# Patient Record
Sex: Male | Born: 1980 | Race: White | Hispanic: No | Marital: Single | Smoking: Never smoker
Health system: Southern US, Community
[De-identification: ages and names within clinical notes are randomized; demographics above are authoritative.]

## PROBLEM LIST (undated history)

## (undated) DIAGNOSIS — F329 Major depressive disorder, single episode, unspecified: Secondary | ICD-10-CM

## (undated) DIAGNOSIS — F32A Depression, unspecified: Secondary | ICD-10-CM

## (undated) DIAGNOSIS — Z21 Asymptomatic human immunodeficiency virus [HIV] infection status: Secondary | ICD-10-CM

## (undated) DIAGNOSIS — B20 Human immunodeficiency virus [HIV] disease: Secondary | ICD-10-CM

## (undated) DIAGNOSIS — F419 Anxiety disorder, unspecified: Secondary | ICD-10-CM

## (undated) HISTORY — DX: Human immunodeficiency virus (HIV) disease: B20

## (undated) HISTORY — DX: Asymptomatic human immunodeficiency virus (hiv) infection status: Z21

## (undated) HISTORY — DX: Depression, unspecified: F32.A

## (undated) HISTORY — DX: Anxiety disorder, unspecified: F41.9

## (undated) HISTORY — PX: OTHER SURGICAL HISTORY: SHX169

## (undated) HISTORY — PX: HERNIA REPAIR: SHX51

## (undated) HISTORY — DX: Major depressive disorder, single episode, unspecified: F32.9

## (undated) HISTORY — PX: GALLBLADDER SURGERY: SHX652

---
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
## 2008-10-29 ENCOUNTER — Ambulatory Visit (HOSPITAL_COMMUNITY): Admission: RE | Admit: 2008-10-29 | Discharge: 2008-10-29 | Payer: Self-pay | Admitting: Surgery

## 2008-11-18 ENCOUNTER — Encounter: Admission: RE | Admit: 2008-11-18 | Discharge: 2008-11-18 | Payer: Self-pay | Admitting: Surgery

## 2008-12-03 ENCOUNTER — Encounter: Admission: RE | Admit: 2008-12-03 | Discharge: 2008-12-03 | Payer: Self-pay | Admitting: Surgery

## 2009-07-22 IMAGING — CT CT PELVIS W/ CM
2 of 4 series · 14 of 32 positions shown, 19 images · IV contrast (READICAT/WATER & [ID] OMNI 300)
Comparison: Anterior abdominal wall (periumbilical) [HOSPITAL]
[HOSPITAL] [REDACTED] [HOSPITAL] ultrasound 11/18/2008.

CT ABDOMEN

CLINICAL DATA: Post ventral herniorrhaphy with operative site
swelling and prior aspiration of fluid.

CT ABDOMEN AND PELVIS WITHOUT AND WITH CONTRAST
TECHNIQUE: Multidetector CT imaging of the abdomen and pelvis was
performed following the standard protocol before and following the
bolus administration of intravenous contrast.
Contrast: Intravenous 100 ml Dmnipaque-M11.

[Series 2: abdomen w/ · axial · 0.70mm/px · z∈[-395,-75]mm · 7 of 86 slices shown, 12 images]
[im 11/86  soft-tissue]
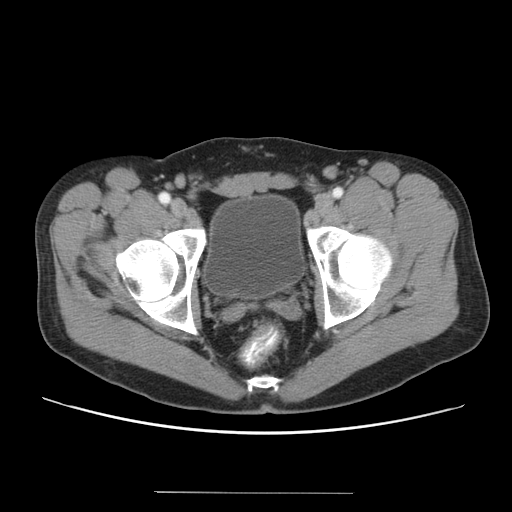
[im 11/86  bone]
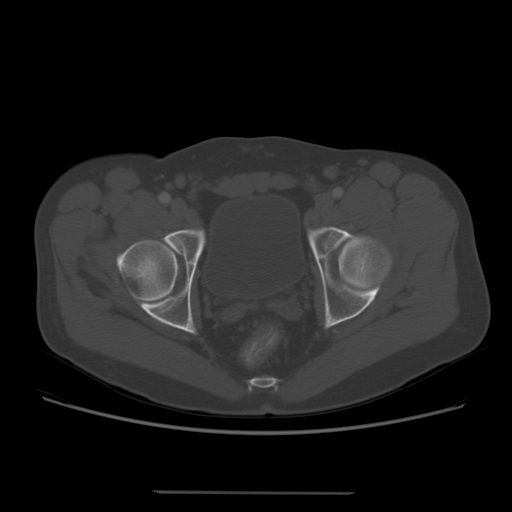
[im 22/86  soft-tissue]
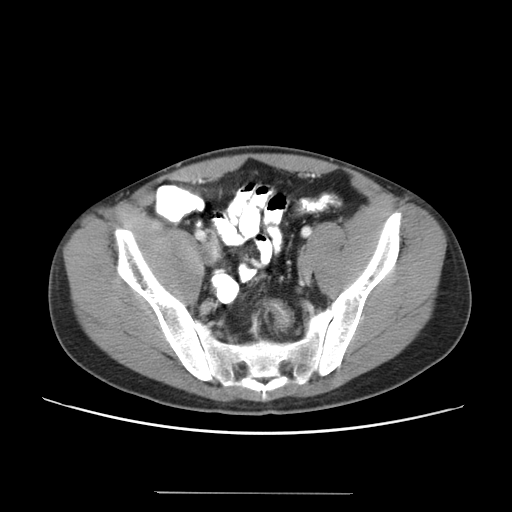
[im 32/86  soft-tissue]
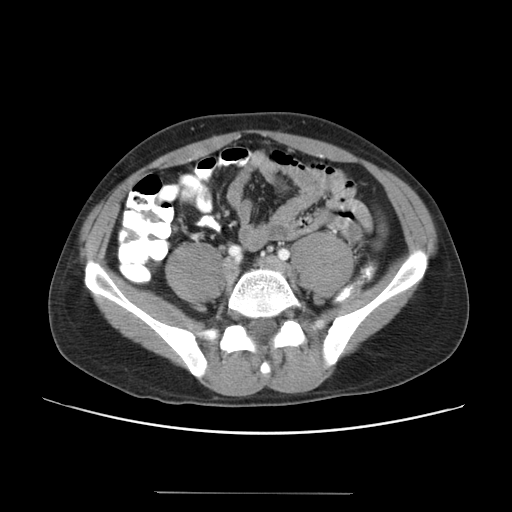
[im 43/86  soft-tissue]
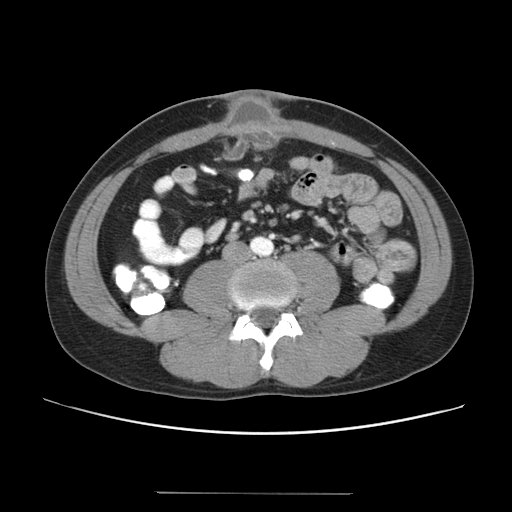
[im 43/86  lung]
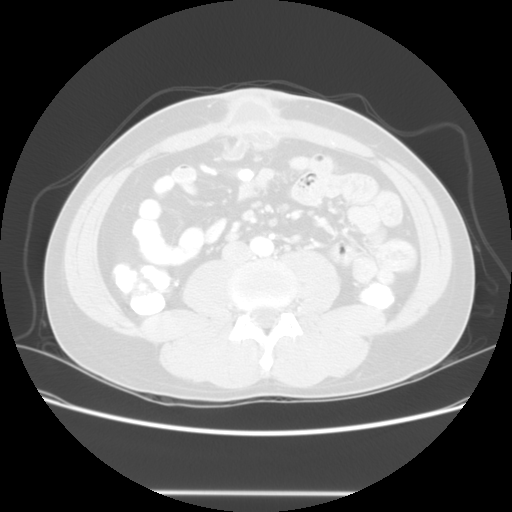
[im 54/86  soft-tissue]
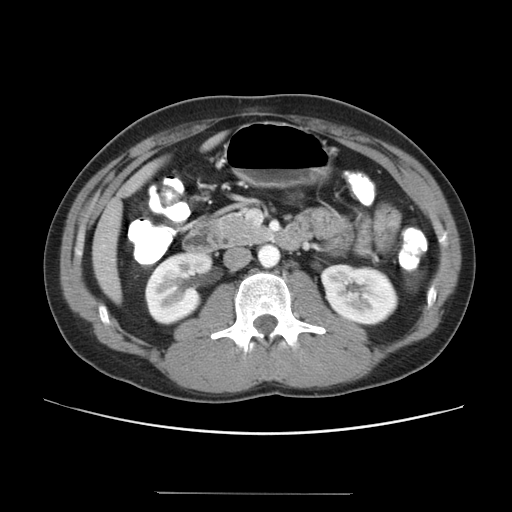
[im 54/86  lung]
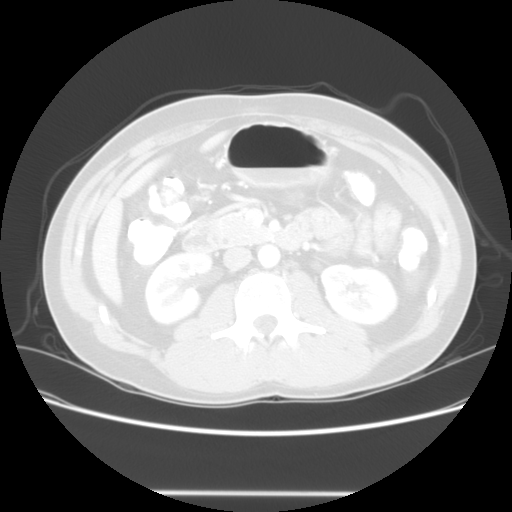
[im 64/86  soft-tissue]
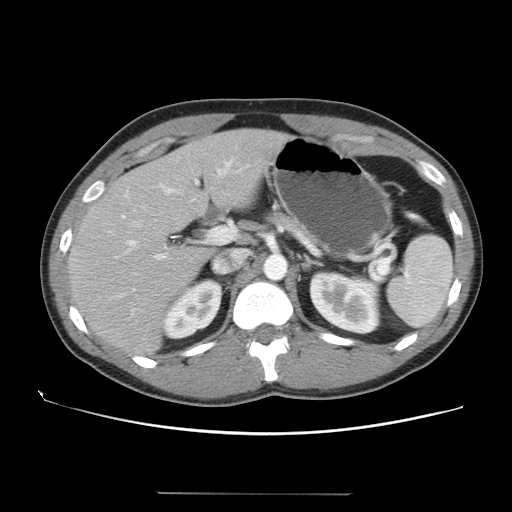
[im 64/86  lung]
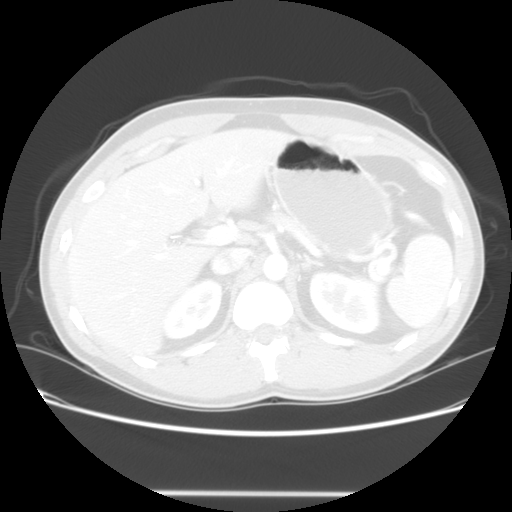
[im 75/86  soft-tissue]
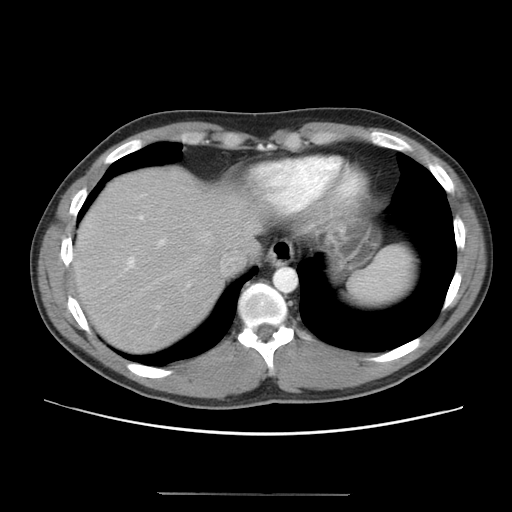
[im 75/86  lung]
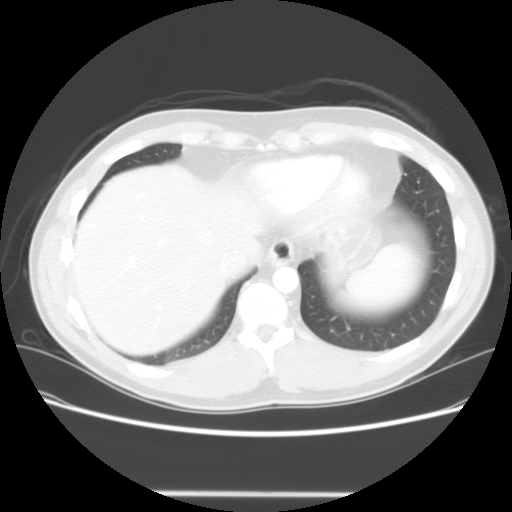

[Series 401: sagittal · sagittal · 0.90mm/px · 7 of 120 slices shown]
[im 11/120  soft-tissue]
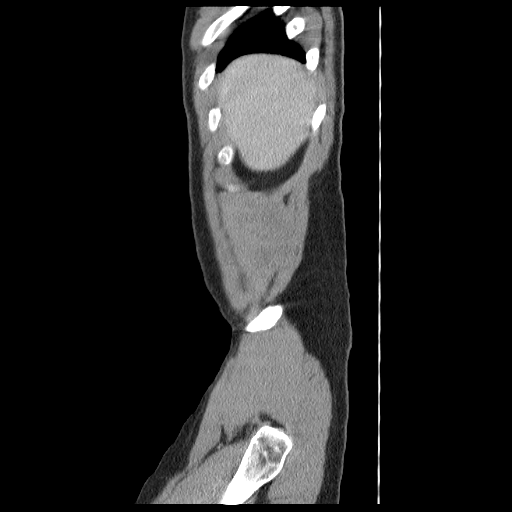
[im 22/120  soft-tissue]
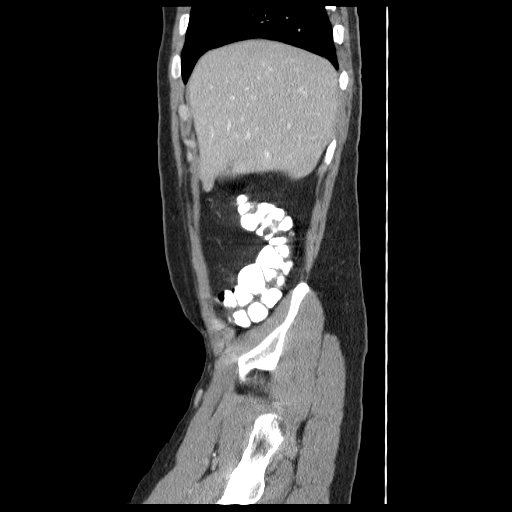
[im 44/120  soft-tissue]
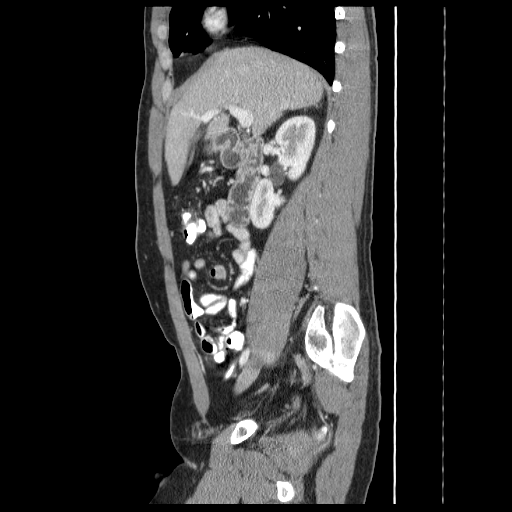
[im 55/120  soft-tissue]
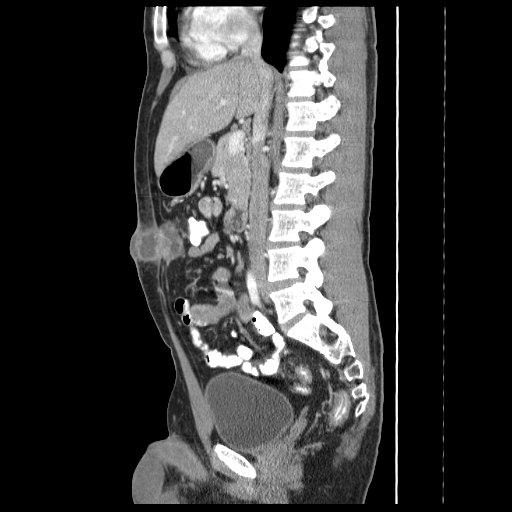
[im 65/120  soft-tissue]
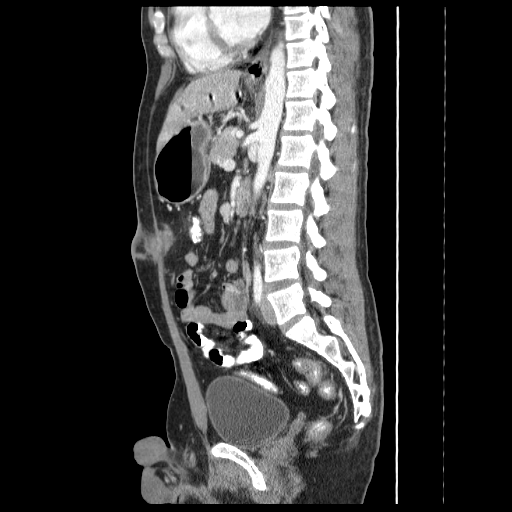
[im 76/120  soft-tissue]
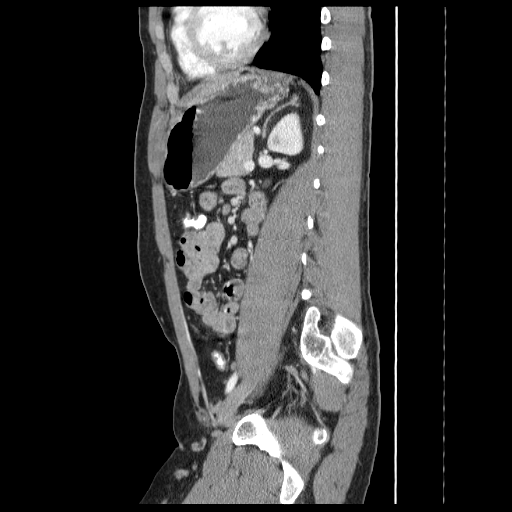
[im 98/120  soft-tissue]
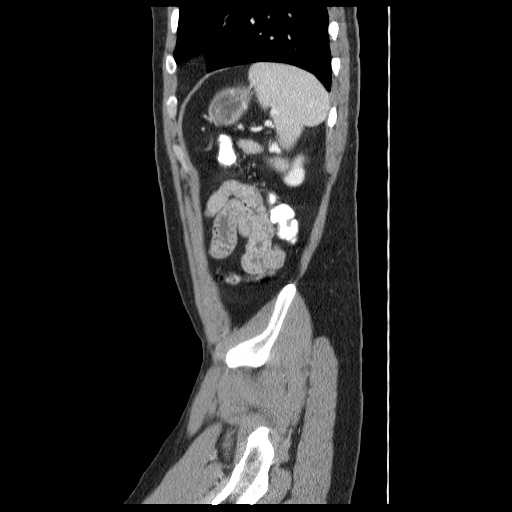

[14 of 32 positions shown; findings below may reference images not displayed]

FINDINGS: Slight decrease in previous complex fluid collection at
periumbilical ventral herniorrhaphy since previous ultrasound
11/18/2008 is seen.  Currently fluid collection measures 3.0 cm
long (sagittal image 57) 1.6 cm AP X 2.8 cm wide (axial image 45)
(11/18/2008 ultrasound measured 4.7 cm long X 1.9 cm AP X 4.9 cm
wide).  No evidence for recurrent ventral hernia is seen.
Subadjacent to herniorrhaphy is marginated serpiginous ventral
intraperitoneal fluid collection measuring 3.7 cm long (sagittal
image 61) X 1.1 cm AP X 4.0 cm wide (axial image 43).  Bases of the
lungs are clear.  Postcholecystectomy state with no dilated biliary
tract visualized.  Incidental very small hiatus hernia is seen with
stomach otherwise normal-appearing.  Remaining abdominal organs
appear normal without inflammation, free fluid, abscess or
adenopathy.
IMPRESSION: 1.  Decreasing size probable seroma at the ventral herniorrhaphy.
Subadjacent intraperitoneal fluid collection favors seroma
(infected seroma/abscess cannot be excluded).
2.  Post cholecystectomy and incidental very small hiatus hernia.
3.  Otherwise, negative.

CT PELVIS
FINDINGS: Pelvic organs appear normal without inflammation, free
fluid or adenopathy.
IMPRESSION: Normal.

## 2010-11-14 NOTE — Op Note (Signed)
NAMEJEVANTE, Johnathan Bowers             ACCOUNT NO.:  [id number]   MEDICAL RECORD NO.:  [id number]          PATIENT TYPE:  AMB   LOCATION:  DAY                          FACILITY:  [facility]   PHYSICIAN:  Wilmon Arms. Corliss Skains, M.D. DATE OF BIRTH:  07/28/1980   DATE OF PROCEDURE:  10/29/2008  DATE OF DISCHARGE:                               OPERATIVE REPORT   PREOPERATIVE DIAGNOSIS:  Recurrent ventral umbilical hernia.   POSTOPERATIVE DIAGNOSIS:  Recurrent ventral umbilical hernia.   PROCEDURE PERFORMED:  Recurrent umbilical hernia repair with mesh.   SURGEON:  Wilmon Arms. Tsuei, M.D.   ANESTHESIA:  General.   INDICATIONS:  The patient is a 30 year old male who is 3 years postop  from a laparoscopic cholecystectomy done in [place], [place].  At the time of surgery he was noted to have a small umbilical hernia.  This was repaired with a 0 Vicryl in figure-of-eight fashion.  The  patient has since developed a hernia bulge which is protruding above his  umbilicus.  This has become uncomfortable and the patient presents  requesting repair.   DESCRIPTION OF PROCEDURE:  The patient was brought to the operating room  and placed in the supine position on the operating  room table.  After  an adequate level of general anesthesia was obtained, the patient's  abdomen was prepped with Betadine and draped in sterile fashion.  A time-  out was taken to assure the proper patient and proper procedure.  I  palpated the hernia which was protruding superiorly and to the right of  his umbilicus.  I was able to mostly reduce the hernia and it felt like  the hernia defect was actually at the lower end of his umbilicus.  Therefore, we opened his previous infraumbilical scar.  We had  lengthened it slightly.  Dissection was carried down through the  subcutaneous tissues to the fascia.  We then worked our way superiorly,  dissecting the umbilicus off of the underlying fascia.  We encountered  the hernia  defect.  There was a moderate amount of omentum that had  herniated through this area.  He had kind of a multiloculated hernia sac  and we were able to peel all of this off of the overlying dermis.  We  reduced the omentum and the hernia sac back into the peritoneal cavity  and undermined the fascia in all directions.  A medium PROCEED ventral  patch was then inserted into the peritoneal cavity and was deployed.  It  seemed to lie flat with no omentum around the edges.  The mesh was  sutured in place with 4 interrupted 0 Novofil pop-off sutures.  The tabs  were cut short and were then secured to the edge of the fascial defect.  The fascial defect was reapproximated with figure-of-eight 0 Novofil  sutures.  A 3-0 Vicryl was used to tack down the dermis to the fascia,  closing down the hernia sac superiorly.  We also tacked down the base of  the  umbilicus to the fascia.  The subcutaneous tissues were closed with 3-0  Vicryl and the subcuticular layer  was closed with 4-0 Monocryl.  Steri-  Strips and clean dressings were applied.  The patient was then extubated  and brought to the recovery room in stable condition.  All sponge,  instrument, and needle counts were correct.      Wilmon Arms. Tsuei, M.D.  Electronically Signed     MKT/MEDQ  D:  10/29/2008  T:  10/29/2008  Job:  [id number]

## 2017-05-02 DIAGNOSIS — Z95810 Presence of automatic (implantable) cardiac defibrillator: Secondary | ICD-10-CM

## 2017-05-02 DIAGNOSIS — I509 Heart failure, unspecified: Secondary | ICD-10-CM

## 2017-05-02 DIAGNOSIS — I4891 Unspecified atrial fibrillation: Secondary | ICD-10-CM

## 2017-05-05 DIAGNOSIS — I509 Heart failure, unspecified: Secondary | ICD-10-CM

## 2017-05-06 DIAGNOSIS — I4891 Unspecified atrial fibrillation: Secondary | ICD-10-CM

## 2017-05-06 DIAGNOSIS — E871 Hypo-osmolality and hyponatremia: Secondary | ICD-10-CM

## 2017-05-06 DIAGNOSIS — I5023 Acute on chronic systolic (congestive) heart failure: Secondary | ICD-10-CM

## 2017-09-09 DIAGNOSIS — B20 Human immunodeficiency virus [HIV] disease: Secondary | ICD-10-CM | POA: Insufficient documentation

## 2017-09-19 ENCOUNTER — Encounter (HOSPITAL_COMMUNITY): Payer: Self-pay | Admitting: Psychiatry

## 2017-09-19 ENCOUNTER — Ambulatory Visit (INDEPENDENT_AMBULATORY_CARE_PROVIDER_SITE_OTHER): Payer: 59 | Admitting: Psychiatry

## 2017-09-19 VITALS — BP 108/68 | HR 104 | Ht 65.0 in | Wt 118.0 lb

## 2017-09-19 DIAGNOSIS — F411 Generalized anxiety disorder: Secondary | ICD-10-CM | POA: Diagnosis not present

## 2017-09-19 DIAGNOSIS — F5102 Adjustment insomnia: Secondary | ICD-10-CM

## 2017-09-19 DIAGNOSIS — F321 Major depressive disorder, single episode, moderate: Secondary | ICD-10-CM | POA: Diagnosis not present

## 2017-09-19 DIAGNOSIS — R450 Nervousness: Secondary | ICD-10-CM | POA: Diagnosis not present

## 2017-09-19 DIAGNOSIS — F4323 Adjustment disorder with mixed anxiety and depressed mood: Secondary | ICD-10-CM

## 2017-09-19 DIAGNOSIS — B20 Human immunodeficiency virus [HIV] disease: Secondary | ICD-10-CM

## 2017-09-19 NOTE — Progress Notes (Signed)
Psychiatric Initial Adult Assessment   Patient Identification: Johnathan Bowers MRN:  [id number] Date of Evaluation:  09/19/2017 Referral Source: Junious SilkJoyin Osa Chief Complaint:   Chief Complaint    Establish Care; Anxiety     Visit Diagnosis:    ICD-10-CM   1. Adjustment disorder with mixed anxiety and depressed mood F43.23   2. Current moderate episode of major depressive disorder, unspecified whether recurrent (HCC) F32.1   3. Anxiety state F41.1   4. Adjustment insomnia F51.02     History of Present Illness:  37 years old white male. Lives with his partner.  Referred for management of anxiety and depression. Sleep technician by profession  Patient has been recently diagnosed with HIV disease. It made him to go into a shock panic and dysphoria he has made an appointment psychiatrist office [facility] so wants to come this office. Seeing a therapist there as well Was started on buspar, ativan, ambien all together. Takes adderall for adhd but has hold off second dose since not at work for last week  He has not told his parents or sister who were strong Christians and they have not known that he has been a gay also the last 15 years they thought he has gone through a straight camp and is straight none K he has been limiting his partner they're not aware that he is living with his partner He has told his partner of HIV but no one else  Works as Research officer, trade unionleep technician in office .  His worries were excessive, panic last week, somewhat more in control but still feels down, fatigue and anxious  has been loosing weight and says he may have gotten pricked by a patients needle a while back leading to current HIV  Aggravating F: recent diagnosed HIV. Family not aware MOdifying F: friend, cousin    Associated Signs/Symptoms: Depression Symptoms:  anhedonia, insomnia, difficulty concentrating, anxiety, (Hypo) Manic Symptoms:  Distractibility, Anxiety Symptoms:   Excessive Worry, Psychotic Symptoms:  denies PTSD Symptoms: Had a traumatic exposure:  HIv diagnosed Hypervigilance:  Yes  Past Psychiatric History: depression at age 37 . Parents wanted him  To go to straight camp  Previous Psychotropic Medications: No   Substance Abuse History in the last 12 months:  No.  Consequences of Substance Abuse: NA  Past Medical History:  Past Medical History:  Diagnosis Date  . Anxiety   . Depression   . HIV (human immunodeficiency virus infection) (HCC)     Past Surgical History:  Procedure Laterality Date  . GALLBLADDER SURGERY    . HERNIA REPAIR    . Tonsillectomy      Family Psychiatric History: denies   Family History: History reviewed. No pertinent family history.  Social History:   Social History   Socioeconomic History  . Marital status: Single    Spouse name: Not on file  . Number of children: Not on file  . Years of education: Not on file  . Highest education level: Not on file  Occupational History  . Not on file  Social Needs  . Financial resource strain: Not on file  . Food insecurity:    Worry: Not on file    Inability: Not on file  . Transportation needs:    Medical: Not on file    Non-medical: Not on file  Tobacco Use  . Smoking status: Never Smoker  . Smokeless tobacco: Never Used  Substance and Sexual Activity  . Alcohol use: Not  on file  . Drug use: Not Currently  . Sexual activity: Not on file  Lifestyle  . Physical activity:    Days per week: Not on file    Minutes per session: Not on file  . Stress: Not on file  Relationships  . Social connections:    Talks on phone: Not on file    Gets together: Not on file    Attends religious service: Not on file    Active member of club or organization: Not on file    Attends meetings of clubs or organizations: Not on file    Relationship status: Not on file  Other Topics Concern  . Not on file  Social History Narrative  . Not on file    Additional  Social History: Grew up with parents. , sister. Strong christians. So still reluctant to tell them or how to tell them that he is Cardell Peach and now has HIV  Allergies:   Allergies  Allergen Reactions  . Hydrocodone-Acetaminophen Anaphylaxis    Throat closes, chest tightens  . Penicillins Rash    Metabolic Disorder Labs: No results found for: HGBA1C, MPG No results found for: PROLACTIN No results found for: CHOL, TRIG, HDL, CHOLHDL, VLDL, LDLCALC   Current Medications: Current Outpatient Medications  Medication Sig Dispense Refill  . amphetamine-dextroamphetamine (ADDERALL) 20 MG tablet TAKE 1 TABLET BY MOUTH 2 TIMES A DAY.    . busPIRone (BUSPAR) 5 MG tablet Take by mouth.    . cloNIDine (CATAPRES) 0.1 MG tablet Take by mouth.    . DESCOVY 200-25 MG tablet     . dolutegravir (TIVICAY) 50 MG tablet Take by mouth.    . escitalopram (LEXAPRO) 20 MG tablet Take by mouth.    . fluconazole (DIFLUCAN) 200 MG tablet     . LORazepam (ATIVAN) 0.5 MG tablet Take by mouth.    . zolpidem (AMBIEN) 10 MG tablet TAKE 1 TABLET BY MOUTH EVERY DAY AT BEDTIME     No current facility-administered medications for this visit.     Neurologic: Headache: No Seizure: No Paresthesias:No  Musculoskeletal: Strength & Muscle Tone: within normal limits Gait & Station: normal Patient leans: no lean  Psychiatric Specialty Exam: Review of Systems  Constitutional: Positive for weight loss.  Cardiovascular: Negative for chest pain.  Psychiatric/Behavioral: Positive for depression. Negative for substance abuse. The patient is nervous/anxious.     Blood pressure 108/68, pulse (!) 104, height 5\' 5"  (1.651 m), weight 118 lb (53.5 kg).Body mass index is 19.64 kg/m.  General Appearance: Casual  Eye Contact:  Fair  Speech:  Normal Rate  Volume:  Decreased  Mood:  Dysphoric  Affect:  Congruent  Thought Process:  Goal Directed  Orientation:  Full (Time, Place, and Person)  Thought Content:  Rumination   Suicidal Thoughts:  No  Homicidal Thoughts:  No  Memory:  Immediate;   Fair Recent;   Fair  Judgement:  Fair  Insight:  Fair  Psychomotor Activity:  Decreased  Concentration:  Concentration: Fair  Recall:  [employer] of Knowledge:Fair  Language: Fair  Akathisia:  Negative  Handed:  Right  AIMS (if indicated):    Assets:  Desire for Improvement  ADL's:  Intact  Cognition: WNL  Sleep:  Variable depending on night shifts    Treatment Plan Summary: Medication management and Plan as follows  1. Major depression, recurrent ; possible adjustment disorder with anxiety and depression  Continue lexapro increase to 20mg   2. GAD/ Panic; on ativan. Can  start tapering down as lexapro will be 20mg   Continue buspar 5mg  bid 3. Adjustment insomnia: reviewed sleep hygiene. Has ativan  Recommend to lower or cut down dose of adderall  He gets his adderall from primary care  Continue therapy to deal with current stress and confide with sister or any close family member to talk about his recent diagnosis . Talk about this in therapy  Ore than 50% time spent in counseling and coordination of care including patient education and review side effects and concerns were addressed  Follow up in 4 weeks or earlier if needed He has meds. Reviewed how to work on taking them    Thresa Ross, MD 3/21/20192:31 PM

## 2017-09-19 NOTE — Patient Instructions (Signed)
  Lower ativan to bid and in one week more lower dose. Stop in 14 days with taper Continue buspar at current dose  Avoid stimulant med at evening. Lower dose of adderall as it can cause anxiety  Increase lexapro to 20mg  qd

## 2017-10-21 ENCOUNTER — Ambulatory Visit (HOSPITAL_COMMUNITY): Payer: Self-pay | Admitting: Psychiatry

## 2017-10-28 ENCOUNTER — Ambulatory Visit (HOSPITAL_COMMUNITY): Payer: Self-pay | Admitting: Psychiatry
